# Patient Record
Sex: Female | Born: 1955 | Race: White | Hispanic: No | Marital: Married | Smoking: Never smoker
Health system: Southern US, Community
[De-identification: ages and names within clinical notes are randomized; demographics above are authoritative.]

## PROBLEM LIST (undated history)

## (undated) DIAGNOSIS — I10 Essential (primary) hypertension: Secondary | ICD-10-CM

## (undated) DIAGNOSIS — I639 Cerebral infarction, unspecified: Secondary | ICD-10-CM

## (undated) DIAGNOSIS — D693 Immune thrombocytopenic purpura: Secondary | ICD-10-CM

## (undated) DIAGNOSIS — M1990 Unspecified osteoarthritis, unspecified site: Secondary | ICD-10-CM

## (undated) HISTORY — PX: GASTRIC BYPASS: SHX52

## (undated) HISTORY — PX: CHOLECYSTECTOMY: SHX55

## (undated) HISTORY — PX: BREAST SURGERY: SHX581

## (undated) HISTORY — PX: AUGMENTATION MAMMAPLASTY: SUR837

---

## 1994-08-16 HISTORY — PX: ABDOMINAL HYSTERECTOMY: SHX81

## 2002-08-16 HISTORY — PX: SPLENECTOMY: SUR1306

## 2010-07-06 ENCOUNTER — Emergency Department (HOSPITAL_BASED_OUTPATIENT_CLINIC_OR_DEPARTMENT_OTHER): Admission: EM | Admit: 2010-07-06 | Discharge: 2010-07-06 | Payer: Self-pay | Admitting: Emergency Medicine

## 2010-08-16 HISTORY — PX: OTHER SURGICAL HISTORY: SHX169

## 2013-10-25 ENCOUNTER — Other Ambulatory Visit: Payer: Self-pay

## 2013-10-25 DIAGNOSIS — Z1231 Encounter for screening mammogram for malignant neoplasm of breast: Secondary | ICD-10-CM

## 2013-11-08 ENCOUNTER — Ambulatory Visit: Payer: Self-pay

## 2013-11-12 ENCOUNTER — Ambulatory Visit
Admission: RE | Admit: 2013-11-12 | Discharge: 2013-11-12 | Disposition: A | Discharge status: Home / Self Care | Payer: BC Managed Care – PPO | Source: Ambulatory Visit

## 2013-11-12 DIAGNOSIS — Z1231 Encounter for screening mammogram for malignant neoplasm of breast: Secondary | ICD-10-CM

## 2015-02-13 ENCOUNTER — Other Ambulatory Visit: Payer: Self-pay

## 2015-02-13 DIAGNOSIS — Z1231 Encounter for screening mammogram for malignant neoplasm of breast: Secondary | ICD-10-CM

## 2015-04-14 ENCOUNTER — Ambulatory Visit
Admission: RE | Admit: 2015-04-14 | Discharge: 2015-04-14 | Disposition: A | Discharge status: Home / Self Care | Payer: BLUE CROSS/BLUE SHIELD | Source: Ambulatory Visit

## 2015-04-14 DIAGNOSIS — Z1231 Encounter for screening mammogram for malignant neoplasm of breast: Secondary | ICD-10-CM

## 2015-08-17 DIAGNOSIS — I639 Cerebral infarction, unspecified: Secondary | ICD-10-CM

## 2015-08-17 HISTORY — DX: Cerebral infarction, unspecified: I63.9

## 2015-08-26 ENCOUNTER — Other Ambulatory Visit: Payer: Self-pay

## 2015-08-26 ENCOUNTER — Emergency Department (HOSPITAL_COMMUNITY): Payer: BLUE CROSS/BLUE SHIELD

## 2015-08-26 ENCOUNTER — Emergency Department (HOSPITAL_COMMUNITY)
Admission: EM | Admit: 2015-08-26 | Discharge: 2015-08-26 | Disposition: A | Discharge status: Home / Self Care | Payer: BLUE CROSS/BLUE SHIELD | Attending: Emergency Medicine | Admitting: Emergency Medicine

## 2015-08-26 ENCOUNTER — Encounter (HOSPITAL_COMMUNITY): Payer: Self-pay | Admitting: Emergency Medicine

## 2015-08-26 DIAGNOSIS — I10 Essential (primary) hypertension: Secondary | ICD-10-CM | POA: Diagnosis not present

## 2015-08-26 DIAGNOSIS — R110 Nausea: Secondary | ICD-10-CM | POA: Diagnosis not present

## 2015-08-26 DIAGNOSIS — R079 Chest pain, unspecified: Secondary | ICD-10-CM | POA: Insufficient documentation

## 2015-08-26 DIAGNOSIS — Z862 Personal history of diseases of the blood and blood-forming organs and certain disorders involving the immune mechanism: Secondary | ICD-10-CM | POA: Insufficient documentation

## 2015-08-26 DIAGNOSIS — R05 Cough: Secondary | ICD-10-CM | POA: Insufficient documentation

## 2015-08-26 HISTORY — DX: Essential (primary) hypertension: I10

## 2015-08-26 HISTORY — DX: Immune thrombocytopenic purpura: D69.3

## 2015-08-26 LAB — CBC
HEMATOCRIT: 43.8 % (ref 36.0–46.0)
Hemoglobin: 14.5 g/dL (ref 12.0–15.0)
MCH: 30.5 pg (ref 26.0–34.0)
MCHC: 33.1 g/dL (ref 30.0–36.0)
MCV: 92 fL (ref 78.0–100.0)
PLATELETS: 415 10*3/uL — AB (ref 150–400)
RBC: 4.76 MIL/uL (ref 3.87–5.11)
RDW: 14.8 % (ref 11.5–15.5)
WBC: 20.1 10*3/uL — AB (ref 4.0–10.5)

## 2015-08-26 LAB — I-STAT TROPONIN, ED
TROPONIN I, POC: 0 ng/mL (ref 0.00–0.08)
Troponin i, poc: 0 ng/mL (ref 0.00–0.08)

## 2015-08-26 LAB — DIFFERENTIAL
BASOS ABS: 0.1 10*3/uL (ref 0.0–0.1)
BASOS PCT: 0 %
EOS PCT: 1 %
Eosinophils Absolute: 0.2 10*3/uL (ref 0.0–0.7)
LYMPHS ABS: 9.7 10*3/uL — AB (ref 0.7–4.0)
Lymphocytes Relative: 48 %
MONOS PCT: 6 %
Monocytes Absolute: 1.1 10*3/uL — ABNORMAL HIGH (ref 0.1–1.0)
NEUTROS ABS: 8.9 10*3/uL — AB (ref 1.7–7.7)
Neutrophils Relative %: 45 %

## 2015-08-26 LAB — BASIC METABOLIC PANEL
Anion gap: 12 (ref 5–15)
BUN: 15 mg/dL (ref 6–20)
CHLORIDE: 98 mmol/L — AB (ref 101–111)
CO2: 32 mmol/L (ref 22–32)
CREATININE: 0.65 mg/dL (ref 0.44–1.00)
Calcium: 10.3 mg/dL (ref 8.9–10.3)
GFR calc non Af Amer: >60 mL/min (ref >60)
Glucose, Bld: 144 mg/dL — ABNORMAL HIGH (ref 65–99)
POTASSIUM: 3.3 mmol/L — AB (ref 3.5–5.1)
Sodium: 142 mmol/L (ref 135–145)

## 2015-08-26 MED ORDER — OMEPRAZOLE 20 MG PO CPDR: 20.0000 mg | DELAYED_RELEASE_CAPSULE | Freq: Every day | ORAL | Status: DC

## 2015-08-26 MED ORDER — GI COCKTAIL ~~LOC~~
30.0000 mL | Freq: Once | ORAL | Status: AC
  Administered 2015-08-26: 30 mL via ORAL
  Filled 2015-08-26: qty 30

## 2015-08-26 MED ORDER — KETOROLAC TROMETHAMINE 30 MG/ML IJ SOLN
30.0000 mg | Freq: Once | INTRAMUSCULAR | Status: AC
  Administered 2015-08-26: 30 mg via INTRAVENOUS
  Filled 2015-08-26: qty 1

## 2015-08-26 MED ORDER — MORPHINE SULFATE (PF) 4 MG/ML IV SOLN
4.0000 mg | Freq: Once | INTRAVENOUS | Status: DC
Start: 2015-08-26 — End: 2015-08-26
  Filled 2015-08-26: qty 1

## 2015-08-26 NOTE — Discharge Instructions (Signed)
Ms. Janet SievingDebra Warren,  Nice meeting you! Please follow-up with your primary care provider within one week. Return to the emergency department if you develop chest pain again, nausea/vomiting, shortness of breath. Feel better soon!  S. Lane HackerNicole Phyllistine Domingos, PA-C

## 2015-08-26 NOTE — ED Provider Notes (Signed)
CSN: [id number]     Arrival date & time 08/26/15  1239 History   First MD Initiated Contact with Patient 08/26/15 1259     Chief Complaint  Patient presents with  . Chest Pain   HPI   Janet Powell is a 60 y.o. F PMH significant for ITP, HTN presenting with a 1 day history of chest pain. She states her pain initially started in her back, radiated to midsternal, but now, it is constant in her midsternal chest. She describes her pain as 10/10 pain scale at its worst, intermittent, pressure, non-radiating currently. She states her pain started last night, she laid down to go to bed, it dissipated, but then it started again today while she was at work. She endorses nausea and a recent URI with a productive cough last week. She denies fevers, chills, emesis, abdominal pain, paresthesias.   Past Medical History  Diagnosis Date  . Hypertension   . 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 Cholecystectomy    . Gastric bypass    . Splenectomy  2004   History reviewed. No pertinent family history. Social History  Substance Use Topics  . Smoking status: Never Smoker   . Smokeless tobacco: None  . Alcohol Use: No   OB History    No data available     Review of Systems  Ten systems are reviewed and are negative for acute change except as noted in the HPI  Allergies  Nsaids  Home Medications   Prior to Admission medications   Not on File   BP 168/79 mmHg  Pulse 68  Temp(Src) 97.9 F (36.6 C) (Oral)  Resp 13  SpO2 98% Physical Exam  Constitutional: She is oriented to person, place, and time. She appears well-developed and well-nourished. No distress.  HENT:  Head: Normocephalic and atraumatic.  Right Ear: External ear normal.  Left Ear: External ear normal.  Nose: Nose normal.  Mouth/Throat: Oropharynx is clear and moist. No oropharyngeal exudate.  Eyes: Conjunctivae are normal. Pupils are equal,  round, and reactive to light. Right eye exhibits no discharge. Left eye exhibits no discharge. No scleral icterus.  Neck: Neck supple. No tracheal deviation present.  Cardiovascular: Normal rate, regular rhythm, normal heart sounds and intact distal pulses.  Exam reveals no gallop and no friction rub.   No murmur heard. Pulmonary/Chest: Effort normal and breath sounds normal. No respiratory distress. She has no wheezes. She has no rales. She exhibits no tenderness.  Abdominal: Soft. Bowel sounds are normal. She exhibits no distension and no mass. There is no tenderness. There is no rebound and no guarding.  Musculoskeletal: She exhibits no edema.  Lymphadenopathy:    She has no cervical adenopathy.  Neurological: She is alert and oriented to person, place, and time. Coordination normal.  Skin: Skin is warm and dry. No rash noted. She is not diaphoretic. No erythema.  Psychiatric: She has a normal mood and affect. Her behavior is normal.  Nursing note and vitals reviewed.   ED Course  Procedures  Labs Review Labs Reviewed  BASIC METABOLIC PANEL - Abnormal; Notable for the following:    Potassium 3.3 (*)    Chloride 98 (*)    Glucose, Bld 144 (*)    All other components within normal limits  CBC - Abnormal; Notable for the following:    WBC 20.1 (*)    Platelets 415 (*)    All  other components within normal limits  DIFFERENTIAL - Abnormal; Notable for the following:    Neutro Abs 8.9 (*)    Lymphs Abs 9.7 (*)    Monocytes Absolute 1.1 (*)    All other components within normal limits  I-STAT TROPOININ, ED  I-STAT TROPOININ, ED   Imaging Review Dg Chest 2 View  08/26/2015  CLINICAL DATA:  Mid chest discomfort, recent URI, elevated white blood cell count, history of previous splenectomy EXAM: CHEST  2 VIEW COMPARISON:  Report of a ease chest CT scan dated June 04, 2010. FINDINGS: The lungs are adequately inflated. There is no focal infiltrate. There is no pleural effusion. The  heart and pulmonary vascularity are normal. The mediastinum is normal in width. The trachea is midline. The bony thorax exhibits no acute abnormality. IMPRESSION: There is no active cardiopulmonary disease. Electronically Signed   By: David  SwazilandJordan M.D.   On: 08/26/2015 13:47   I have personally reviewed and evaluated these images and lab results as part of my medical decision-making.   EKG Interpretation   Date/Time:  Tuesday August 26 2015 12:48:17 EST Ventricular Rate:  77 PR Interval:  148 QRS Duration: 88 QT Interval:  384 QTC Calculation: 434 R Axis:   64 Text Interpretation:  Normal sinus rhythm Septal infarct , age  undetermined Abnormal ECG Sinus rhythm q waves anteriorly Abnormal ekg  Confirmed by Gerhard MunchLOCKWOOD, ROBERT  MD [phone]([id number]) on 08/26/2015 1:00:28 PM      MDM   Final diagnoses:  Chest pain, unspecified chest pain type   Patient non-toxic appearing. Hypertensive on exam, otherwise, VSS. Based on patient history and physical exam, most likely etiologies include ACS vs MSK vs anxiety. Less likely etiologies include  Prinzmetal's/cocaine-induced angina, pericarditis/pericardial effusion, cardiac tamponade, constrictive pericarditis, myocarditis, aortic dissection, thoracic aortic aneurysm, CHF/acute pulmonary edema,  pneumonia, pleuritis, pneumothorax, tension pneumothorax, pulmonary embolism, pulmonary HTN, GERD, esophageal spasm, Mallory-Weiss tear, Boerhaave syndrome, peptic ulcer diease, biliary disease, pancreatitis, herpes zoster, sickle cell chest crisis. BMP with mild hypokalemia of 3.3, Cl or 98, hyperglycemia of 144. CBC with leukocytosis of 20.1, plt 415, ab neutro 8.9, ab lym 9.7, mono abs 1.1, howell/jolly bodies, atypical lymphocyctes, large platelets present. Patient is s/p splenectomy for ITP.   Troponin x 2, EKG x 2 unremarkable for acute change.  Patient feeling better upon reassessment after GI cocktail, but states she moved a "certain way," and her pain  returned. Patient may be safely discharged home with PPI. Discussed reasons for return. Patient to follow-up with primary care provider within one week. Patient in understanding and agreement with the plan.   Melton KrebsSamantha Nicole Adolf Ormiston, PA-C 08/28/15 1303  Gerhard Munchobert Lockwood, MD 08/29/15 2204

## 2015-08-26 NOTE — ED Notes (Signed)
Pt sts midsternal CP through to back intermittent since last night worse today with nausea

## 2016-03-18 ENCOUNTER — Other Ambulatory Visit: Payer: Self-pay | Admitting: Internal Medicine

## 2016-03-18 DIAGNOSIS — Z1231 Encounter for screening mammogram for malignant neoplasm of breast: Secondary | ICD-10-CM

## 2016-04-14 ENCOUNTER — Ambulatory Visit
Admission: RE | Admit: 2016-04-14 | Discharge: 2016-04-14 | Disposition: A | Discharge status: Home / Self Care | Payer: BLUE CROSS/BLUE SHIELD | Source: Ambulatory Visit | Attending: Internal Medicine | Admitting: Internal Medicine

## 2016-04-14 DIAGNOSIS — Z1231 Encounter for screening mammogram for malignant neoplasm of breast: Secondary | ICD-10-CM

## 2016-08-31 ENCOUNTER — Ambulatory Visit: Payer: BLUE CROSS/BLUE SHIELD

## 2016-08-31 ENCOUNTER — Other Ambulatory Visit: Payer: Self-pay | Admitting: Nurse Practitioner

## 2016-08-31 ENCOUNTER — Ambulatory Visit (INDEPENDENT_AMBULATORY_CARE_PROVIDER_SITE_OTHER): Payer: BLUE CROSS/BLUE SHIELD

## 2016-08-31 DIAGNOSIS — I639 Cerebral infarction, unspecified: Secondary | ICD-10-CM

## 2016-08-31 DIAGNOSIS — I672 Cerebral atherosclerosis: Secondary | ICD-10-CM

## 2016-08-31 MED ORDER — IOPAMIDOL (ISOVUE-370) INJECTION 76%
100.0000 mL | Freq: Once | INTRAVENOUS | Status: AC | PRN
  Administered 2016-08-31: 100 mL via INTRAVENOUS

## 2016-09-10 HISTORY — PX: OTHER SURGICAL HISTORY: SHX169

## 2017-02-28 ENCOUNTER — Other Ambulatory Visit: Payer: Self-pay | Admitting: Internal Medicine

## 2017-02-28 DIAGNOSIS — Z1231 Encounter for screening mammogram for malignant neoplasm of breast: Secondary | ICD-10-CM

## 2017-04-15 ENCOUNTER — Ambulatory Visit
Admission: RE | Admit: 2017-04-15 | Discharge: 2017-04-15 | Disposition: A | Discharge status: Home / Self Care | Payer: BLUE CROSS/BLUE SHIELD | Source: Ambulatory Visit | Attending: Internal Medicine | Admitting: Internal Medicine

## 2017-04-15 DIAGNOSIS — Z1231 Encounter for screening mammogram for malignant neoplasm of breast: Secondary | ICD-10-CM

## 2018-02-09 NOTE — H&P (Signed)
   Janet KidneyDebra Joellyn RuedWarren Powell is an 62 y.o. female.    Chief Complaint: right knee pain  HPI: Pt is a 62 y.o. female complaining of right knee pain for several weeks. Pain had continually increased since the beginning. X-rays in the clinic show meniscal tear right knee. Pt has tried various conservative treatments which have failed to alleviate their symptoms, including injections and therapy. Various options are discussed with the patient. Risks, benefits and expectations were discussed with the patient. Patient understand the risks, benefits and expectations and wishes to proceed with surgery.   PCP:  Malka SoJobe, Daniel B., MD  D/C Plans: Home  PMH: Past Medical History:  Diagnosis Date  . Hypertension   . Idiopathic thrombocytopenic purpura (ITP)    had spenectomy    PSH: Past Surgical History:  Procedure Laterality Date  . CHOLECYSTECTOMY    . GASTRIC BYPASS    . SPLENECTOMY  2004    Social History:  reports that she has never smoked. She does not have any smokeless tobacco history on file. She reports that she does not drink alcohol or use drugs.  Allergies:  Allergies  Allergen Reactions  . Lisinopril     cough  . Nsaids Other (See Comments)    Because of gastric bypass    Medications: No current facility-administered medications for this encounter.    Current Outpatient Medications  Medication Sig Dispense Refill  . amLODipine (NORVASC) 10 MG tablet Take 10 mg by mouth daily.    . Calcium Citrate-Vitamin D (CALCIUM CITRATE + D) 315-250 MG-UNIT TABS Take 1 tablet by mouth 2 (two) times daily.    . cyanocobalamin ([employer] VITAMIN B12) 2000 MCG tablet Take 2,000 mcg by mouth 2 (two) times daily.    . hydrochlorothiazide (HYDRODIURIL) 25 MG tablet Take 25 mg by mouth daily.    . multivitamin (VIT W/EXTRA C) CHEW chewable tablet Chew 1 tablet by mouth daily.    Marland Kitchen. omeprazole (PRILOSEC) 20 MG capsule Take 1 capsule (20 mg total) by mouth daily. 30 capsule 0  . traZODone (DESYREL)  50 MG tablet Take 100 mg by mouth at bedtime.      No results found for this or any previous visit (from the past 48 hour(s)). No results found.  ROS: Pain with rom of the right lower extremity  Physical Exam: Alert and oriented 62 y.o. female in no acute distress Cranial nerves 2-12 intact Cervical spine: full rom with no tenderness, nv intact distally Chest: active breath sounds bilaterally, no wheeze rhonchi or rales Heart: regular rate and rhythm, no murmur Abd: non tender non distended with active bowel sounds Hip is stable with rom  Right knee with positive mcmurrays Pain with rom and twisting nv intact distally Slight antalgic gait  Assessment/Plan Assessment: right knee pain secondary to meniscal tear  Plan:  Patient will undergo a right knee scope [facility]. Risks benefits and expectations were discussed with the patient. Patient understand risks, benefits and expectations and wishes to proceed. The proposed joint has been templated with x-rays prior to surgery with the corresponding company representative.  Alphonsa OverallBrad Jarion Hawthorne PA-C, MPAS [facility] now [facility]  [place] [address], [place], [place] [address] Phone: [phone] [email or web address] Facebook  [employer]  LinkedIn  Twitter

## 2018-02-20 NOTE — Pre-Procedure Instructions (Signed)
Janet Powell  02/20/2018      [id number] - [place], [place] - [address] AT [facility] [address] [place] [address] Phone: [phone] Fax: [phone]    Your procedure is scheduled on Fri., March 03, 2018 from 7:30AM-8:30AM  Report to [facility] Admitting Entrance "A" at 5:30AM  Call this number if you have problems the morning of surgery:  [phone]   Remember:  Do not eat or drink after midnight on July 18th    Take these medicines the morning of surgery with A SIP OF WATER: AmLODipine (NORVASC) and Pantoprazole (PROTONIX)   Follow your doctors instructions regarding your Aspirin and Plavix.  If no instructions were given by your doctor, then you will need to call the prescribing office office to get instructions.    7 days before surgery (7/12), stop taking all Other Aspirin Products, Vitamins, Fish oils, and Herbal medications. Also stop all NSAIDS i.e. Advil, Ibuprofen, Motrin, Aleve, Anaprox, Naproxen, BC, Goody Powders, and all Supplements.    Do not wear jewelry, make-up or nail polish.  Do not wear lotions, powders, or perfumes, or deodorant.  Do not shave 48 hours prior to surgery.   Do not bring valuables to the hospital.  [facility] is not responsible for any belongings or valuables.  Contacts, dentures or bridgework may not be worn into surgery.  Leave your suitcase in the car.  After surgery it may be brought to your room.  For patients admitted to the hospital, discharge time will be determined by your treatment team.  Patients discharged the day of surgery will not be allowed to drive home.   Special instructions:  [place]- Preparing For Surgery  Before surgery, you can play an important role. Because skin is not sterile, your skin needs to be as free of germs as possible. You can reduce the number of germs on your skin by washing with CHG (chlorahexidine gluconate) Soap  before surgery.  CHG is an antiseptic cleaner which kills germs and bonds with the skin to continue killing germs even after washing.    Oral Hygiene is also important to reduce your risk of infection.  Remember - BRUSH YOUR TEETH THE MORNING OF SURGERY WITH YOUR REGULAR TOOTHPASTE  Please do not use if you have an allergy to CHG or antibacterial soaps. If your skin becomes reddened/irritated stop using the CHG.  Do not shave (including legs and underarms) for at least 48 hours prior to first CHG shower. It is OK to shave your face.  Please follow these instructions carefully.   1. Shower the NIGHT BEFORE SURGERY and the MORNING OF SURGERY with CHG.   2. If you chose to wash your hair, wash your hair first as usual with your normal shampoo.  3. After you shampoo, rinse your hair and body thoroughly to remove the shampoo.  4. Use CHG as you would any other liquid soap. You can apply CHG directly to the skin and wash gently with a scrungie or a clean washcloth.   5. Apply the CHG Soap to your body ONLY FROM THE NECK DOWN.  Do not use on open wounds or open sores. Avoid contact with your eyes, ears, mouth and genitals (private parts). Wash Face and genitals (private parts)  with your normal soap.  6. Wash thoroughly, paying special attention to the area where your surgery will be performed.  7. Thoroughly  rinse your body with warm water from the neck down.  8. DO NOT shower/wash with your normal soap after using and rinsing off the CHG Soap.  9. Pat yourself dry with a CLEAN TOWEL.  10. Wear CLEAN PAJAMAS to bed the night before surgery, wear comfortable clothes the morning of surgery  11. Place CLEAN SHEETS on your bed the night of your first shower and DO NOT SLEEP WITH PETS.  Day of Surgery:  Do not apply any deodorants/lotions.  Please wear clean clothes to the hospital/surgery center.   Remember to brush your teeth WITH YOUR REGULAR TOOTHPASTE.  Please read over the following  fact sheets that you were given. Pain Booklet, Coughing and Deep Breathing and Surgical Site Infection Prevention

## 2018-02-22 ENCOUNTER — Inpatient Hospital Stay (HOSPITAL_COMMUNITY)
Admission: RE | Admit: 2018-02-22 | Discharge: 2018-02-22 | Disposition: A | Discharge status: Home / Self Care | Payer: BLUE CROSS/BLUE SHIELD | Source: Ambulatory Visit

## 2018-02-28 ENCOUNTER — Inpatient Hospital Stay (HOSPITAL_COMMUNITY): Admission: RE | Admit: 2018-02-28 | Payer: Worker's Compensation | Source: Ambulatory Visit

## 2018-05-03 ENCOUNTER — Other Ambulatory Visit (HOSPITAL_COMMUNITY): Payer: Worker's Compensation

## 2018-05-11 ENCOUNTER — Other Ambulatory Visit: Payer: Self-pay | Admitting: Physician Assistant

## 2018-05-11 DIAGNOSIS — Z1231 Encounter for screening mammogram for malignant neoplasm of breast: Secondary | ICD-10-CM

## 2018-05-12 ENCOUNTER — Encounter (HOSPITAL_COMMUNITY): Admission: RE | Payer: Self-pay | Source: Ambulatory Visit

## 2018-05-12 ENCOUNTER — Ambulatory Visit (HOSPITAL_COMMUNITY)
Admission: RE | Admit: 2018-05-12 | Payer: Worker's Compensation | Source: Ambulatory Visit | Admitting: Orthopedic Surgery

## 2018-05-12 SURGERY — ARTHROSCOPY, KNEE
Anesthesia: Monitor Anesthesia Care | Site: Knee | Laterality: Right

## 2018-05-25 NOTE — H&P (Signed)
Patient's anticipated LOS is less than 2 midnights, meeting these requirements: - Younger than 4 - Lives within 1 hour of care - Has a competent adult at home to recover with post-op recover - NO history of  - Chronic pain requiring opiods  - Diabetes  - Coronary Artery Disease  - Heart failure  - Heart attack  - Stroke  - DVT/VTE  - Cardiac arrhythmia  - Respiratory Failure/COPD  - Renal failure  - Anemia  - Advanced Liver disease       Janet Powell is an 62 y.o. female.    Chief Complaint: right knee pain  HPI: Pt is a 62 y.o. female complaining of right knee pain for multiple years. Pain had continually increased since the beginning. X-rays in the clinic show meniscal tear right knee. Pt has tried various conservative treatments which have failed to alleviate their symptoms, including injections and therapy. Various options are discussed with the patient. Risks, benefits and expectations were discussed with the patient. Patient understand the risks, benefits and expectations and wishes to proceed with surgery.   PCP:  Malka So., MD  D/C Plans: Home  PMH: Past Medical History:  Diagnosis Date  . Hypertension   . Idiopathic thrombocytopenic purpura (ITP)    had spenectomy    PSH: Past Surgical History:  Procedure Laterality Date  . CHOLECYSTECTOMY    . GASTRIC BYPASS    . SPLENECTOMY  2004    Social History:  reports that she has never smoked. She does not have any smokeless tobacco history on file. She reports that she does not drink alcohol or use drugs.  Allergies:  Allergies  Allergen Reactions  . Lisinopril     cough  . Nsaids Other (See Comments)    Because of gastric bypass    Medications: No current facility-administered medications for this encounter.    Current Outpatient Medications  Medication Sig Dispense Refill  . amLODipine (NORVASC) 5 MG tablet Take 5 mg by mouth daily.    Marland Kitchen aspirin EC 81 MG tablet Take 81 mg by  mouth daily.    Marland Kitchen atorvastatin (LIPITOR) 40 MG tablet Take 40 mg by mouth daily.    . Calcium Carb-Cholecalciferol (CALCIUM 600 + D PO) Take 1 tablet by mouth daily.     . clopidogrel (PLAVIX) 75 MG tablet Take 75 mg by mouth daily.    . Cyanocobalamin (B-12) 1000 MCG SUBL Place 1,000 mcg under the tongue daily.    . pantoprazole (PROTONIX) 40 MG tablet Take 40 mg by mouth daily.    . Pediatric Multiple Vit-C-FA (PEDIATRIC MULTIVITAMIN) chewable tablet Chew 1 tablet by mouth 2 (two) times daily.    Marland Kitchen telmisartan (MICARDIS) 40 MG tablet Take 40 mg by mouth daily.    . traZODone (DESYREL) 50 MG tablet Take 100 mg by mouth at bedtime.    Marland Kitchen omeprazole (PRILOSEC) 20 MG capsule Take 1 capsule (20 mg total) by mouth daily. (Patient not taking: Reported on 02/20/2018) 30 capsule 0    No results found for this or any previous visit (from the past 48 hour(s)). No results found.  ROS: Pain with rom of the right lower extremity  Physical Exam: Alert and oriented 62 y.o. female in no acute distress Cranial nerves 2-12 intact Cervical spine: full rom with no tenderness, nv intact distally Chest: active breath sounds bilaterally, no wheeze rhonchi or rales Heart: regular rate and rhythm, no murmur Abd: non tender non distended with active bowel sounds  Hip is stable with rom  Right knee with medial and lateral joint line tenderness nv intact distally Slight antalgic gait  Assessment/Plan Assessment: right knee pain secondary to meniscal tear  Plan:  Patient will undergo a right knee scope by Dr. Ranell Patrick at [facility]. Risks benefits and expectations were discussed with the patient. Patient understand risks, benefits and expectations and wishes to proceed. Preoperative templating of the joint replacement has been completed, documented, and submitted to the Operating Room personnel in order to optimize intra-operative equipment management.   Alphonsa Overall PA-C, MPAS [facility] is now  [employer] [address], [address], [place], [place] [address] Phone: [phone] [email or web address] Facebook  [employer]

## 2018-05-25 NOTE — Pre-Procedure Instructions (Signed)
Janet Powell  05/25/2018      [id number] - [place], [place] - [address] AT [facility] [address] [place] [address] Phone: [phone] Fax: [phone]    Your procedure is scheduled on June 02, 2018.  Report to [facility] Admitting at [place] AM.  Call this number if you have problems the morning of surgery:  [phone]   Remember:  Do not eat or drink after midnight.    Take these medicines the morning of surgery with A SIP OF WATER  Amlodipine (norvasc) Pantoprazole (protonix)  Follow your surgeon's instructions on when to hold/resume aspirin/plavix.  If no instructions were given call the office to determine how they would like to you take aspirin/plavix  7 days prior to surgery STOP taking any Aleve, Naproxen, Ibuprofen, Motrin, Advil, Goody's, BC's, all herbal medications, fish oil, and all vitamins    Do not wear jewelry, make-up or nail polish.  Do not wear lotions, powders, or perfumes, or deodorant.  Do not shave 48 hours prior to surgery.    Do not bring valuables to the hospital.  [facility] is not responsible for any belongings or valuables.  Contacts, dentures or bridgework may not be worn into surgery.  Leave your suitcase in the car.  After surgery it may be brought to your room.  For patients admitted to the hospital, discharge time will be determined by your treatment team.  Patients discharged the day of surgery will not be allowed to drive home.    [place]- Preparing For Surgery  Before surgery, you can play an important role. Because skin is not sterile, your skin needs to be as free of germs as possible. You can reduce the number of germs on your skin by washing with CHG (chlorahexidine gluconate) Soap before surgery.  CHG is an antiseptic cleaner which kills germs and bonds with the skin to continue killing germs even after washing.    Oral Hygiene is also  important to reduce your risk of infection.  Remember - BRUSH YOUR TEETH THE MORNING OF SURGERY WITH YOUR REGULAR TOOTHPASTE  Please do not use if you have an allergy to CHG or antibacterial soaps. If your skin becomes reddened/irritated stop using the CHG.  Do not shave (including legs and underarms) for at least 48 hours prior to first CHG shower. It is OK to shave your face.  Please follow these instructions carefully.   1. Shower the NIGHT BEFORE SURGERY and the MORNING OF SURGERY with CHG.   2. If you chose to wash your hair, wash your hair first as usual with your normal shampoo.  3. After you shampoo, rinse your hair and body thoroughly to remove the shampoo.  4. Use CHG as you would any other liquid soap. You can apply CHG directly to the skin and wash gently with a scrungie or a clean washcloth.   5. Apply the CHG Soap to your body ONLY FROM THE NECK DOWN.  Do not use on open wounds or open sores. Avoid contact with your eyes, ears, mouth and genitals (private parts). Wash Face and genitals (private parts)  with your normal soap.  6. Wash thoroughly, paying special attention to the area where your surgery will be performed.  7. Thoroughly rinse your body with warm water from the neck down.  8. DO NOT shower/wash with your normal soap after using and rinsing off the  CHG Soap.  9. Pat yourself dry with a CLEAN TOWEL.  10. Wear CLEAN PAJAMAS to bed the night before surgery, wear comfortable clothes the morning of surgery  11. Place CLEAN SHEETS on your bed the night of your first shower and DO NOT SLEEP WITH PETS.  Day of Surgery:  Do not apply any deodorants/lotions.  Please wear clean clothes to the hospital/surgery center.   Remember to brush your teeth WITH YOUR REGULAR TOOTHPASTE.  Please read over the fact sheets that you were given.

## 2018-05-29 ENCOUNTER — Encounter (HOSPITAL_COMMUNITY)
Admission: RE | Admit: 2018-05-29 | Discharge: 2018-05-29 | Disposition: A | Discharge status: Home / Self Care | Payer: Worker's Compensation | Source: Ambulatory Visit | Attending: Orthopedic Surgery | Admitting: Orthopedic Surgery

## 2018-05-29 ENCOUNTER — Other Ambulatory Visit: Payer: Self-pay

## 2018-05-29 ENCOUNTER — Encounter (HOSPITAL_COMMUNITY): Payer: Self-pay

## 2018-05-29 DIAGNOSIS — Z01812 Encounter for preprocedural laboratory examination: Secondary | ICD-10-CM | POA: Insufficient documentation

## 2018-05-29 DIAGNOSIS — S83206A Unspecified tear of unspecified meniscus, current injury, right knee, initial encounter: Secondary | ICD-10-CM | POA: Diagnosis not present

## 2018-05-29 DIAGNOSIS — X58XXXA Exposure to other specified factors, initial encounter: Secondary | ICD-10-CM | POA: Insufficient documentation

## 2018-05-29 HISTORY — DX: Cerebral infarction, unspecified: I63.9

## 2018-05-29 HISTORY — DX: Unspecified osteoarthritis, unspecified site: M19.90

## 2018-05-29 LAB — BASIC METABOLIC PANEL
ANION GAP: 5 (ref 5–15)
BUN: 17 mg/dL (ref 8–23)
CALCIUM: 9.7 mg/dL (ref 8.9–10.3)
CO2: 29 mmol/L (ref 22–32)
CREATININE: 1.04 mg/dL — AB (ref 0.44–1.00)
Chloride: 110 mmol/L (ref 98–111)
GFR calc Af Amer: >60 mL/min (ref >60)
GFR, EST NON AFRICAN AMERICAN: 56 mL/min — AB (ref >60)
GLUCOSE: 107 mg/dL — AB (ref 70–99)
Potassium: 4.3 mmol/L (ref 3.5–5.1)
Sodium: 144 mmol/L (ref 135–145)

## 2018-05-29 LAB — CBC
HCT: 38.4 % (ref 36.0–46.0)
Hemoglobin: 11.7 g/dL — ABNORMAL LOW (ref 12.0–15.0)
MCH: 29.6 pg (ref 26.0–34.0)
MCHC: 30.5 g/dL (ref 30.0–36.0)
MCV: 97.2 fL (ref 80.0–100.0)
PLATELETS: 398 10*3/uL (ref 150–400)
RBC: 3.95 MIL/uL (ref 3.87–5.11)
RDW: 14.7 % (ref 11.5–15.5)
WBC: 11.3 10*3/uL — ABNORMAL HIGH (ref 4.0–10.5)
nRBC: 0 % (ref 0.0–0.2)

## 2018-05-29 LAB — PROTIME-INR
INR: 0.94
PROTHROMBIN TIME: 12.5 s (ref 11.4–15.2)

## 2018-05-29 NOTE — Progress Notes (Addendum)
PCP: Romie Jumper, PA-C  Cardiologist: pt denies  EKG: 04/2018 in Care Everywhere-requested from Romie Jumper, PA-C  Stress test: 2017-Care Everywhere -requested from Romie Jumper PA-C  ECHO: 08/2016 results in Care everywhere  Cardiac Cath:  Pt denies  Chest x-ray: pt denies past year, no recent respiratory infections/complications

## 2018-05-30 NOTE — Progress Notes (Addendum)
Anesthesia Chart Review:  Case:  [id number] Date/Time:  06/02/18 1315   Procedure:  RIGHT KNEE ARTHROSCOPY WITH MEDIAL AND LATERAL MENISECTOMY (Right Knee)   Anesthesia type:  Monitor Anesthesia Care   Pre-op diagnosis:  Right knee medial and lateral meniscal tears   Location:  [facility] ROOM 05 / [facility]   Surgeon:  Beverely Low, MD     DISCUSSION: 62 yo female never smoker. Pertinent hx includes HTN, ITP s/p splenectomy, Chronic leukocytosis, Multiple Stroke/TIA (s/p L M1-2 angioplasty 09/08/16), Obesity s/p gastric bypass.  She was seen by neurology Thereasa Solo NP on 05/22/2018 for preop clearance. Per her note in care everywhere "Proceed with surgery next week. Plan to stop plavix 2 days prior to surgery, remain on aspirin. This was discussed between neurosurgery and ortho surgical team. Patient to ask surgeon when safe to restart plavix."  She was seen by her PCP 05/08/2018 for f/u of HLD and HTN. At that appointment it was also discussed that the pt was having some chest pain that sounded atypical. However, given her hx of multiple TIA/CVA, a referral was made to cardiology. It does not appear pt has seen cardiology yet.  Pt has had multiple echos for previous stroke workup. Most recent TTE 09/06/2016 showed new wall motion abnormalities compared to TTE from 08/12/2016.  TTE showing WMA was done during hospitalization Jan 2018 during which pt had L M2 angioplasty and stenting. Notes in care everywhere state that cardiology was consulted prior to angioplasty because of the echo and stated " No additional work-up necessary, ok to proceed taking risk/benefit ratio into consideration". I do not see any outpatient cardiac followup.  Given pt's recent referral to cardiology for chest pain and history of abn echo with WMA, I discussed the case with Dr. Krista Blue. He advised that the pt should have cardiac clearance prior to elective orthopedic surgery. I called Dr. Dietrich Pates scheduler, Jacki Cones, and let her know.  Cardiac clearance pending.  Addendum 06/01/2018: Pt was seen by cardiology for preop clearance by Elwin Sleight, MD 06/01/2018. Per her note in care everywhere: "Pre-surgical risk assessment R Knee arthroplasty Asymptomatic; euvolemic and well perfused Able to perform well over 4 METS Reviewed echo images from 2018; no significant abnormalities with normal LVEF Given high functional status, there is no indication for further cardiac work-up at this time"   VS: BP (!) 138/53   Pulse 65   Temp 36.8 C (Oral)   Resp 18   Ht 5\' 1"  (1.549 m)   Wt 72.3 kg   SpO2 99%   BMI 30.12 kg/m   PROVIDERS: Malka So., MD is PCP  Thereasa Solo, NP is Neurology provider  Lemar Livings, MD is Neurosurgeon  LABS: Labs reviewed: Acceptable for surgery. (all labs ordered are listed, but only abnormal results are displayed)  Labs Reviewed  BASIC METABOLIC PANEL - Abnormal; Notable for the following components:      Result Value   Glucose, Bld 107 (*)    Creatinine, Ser 1.04 (*)    GFR calc non Af Amer 56 (*)    All other components within normal limits  CBC - Abnormal; Notable for the following components:   WBC 11.3 (*)    Hemoglobin 11.7 (*)    All other components within normal limits  PROTIME-INR     IMAGES: MRI Brain 01/27/2018 (care everywhere): FINDINGS: Calvarium/skull base: No focal marrow replacing lesion. Degenerative changes of the partially imaged cervical spine, most pronounced at C5-6. Orbits: No focal  mass. Paranasal sinuses: No air-fluid levels or substantial mucosal disease. Brain: No evidence of acute abnormality. No significant interval change in appearance of the brain parenchyma, as previously described in detail. No evidence of acute infarct. No mass effect, acute hemorrhage, or hydrocephalus. Grossly normal flow-related signal in the major intracranial arteries and dural sinuses.     Additional comments: The left middle cerebral artery stent is not well  demonstrated by MR technique.  CONCLUSION:  No acute or interval ischemia.  CHEST  2 VIEW 08/26/2015:  COMPARISON:  Report of a ease chest CT scan dated June 04, 2010.  FINDINGS: The lungs are adequately inflated. There is no focal infiltrate. There is no pleural effusion. The heart and pulmonary vascularity are normal. The mediastinum is normal in width. The trachea is midline. The bony thorax exhibits no acute abnormality.  IMPRESSION: There is no active cardiopulmonary disease.  EKG: 05/08/2018 (care everywhere): NSR rate 64  CV: TTE 09/06/2016 (care everywhere):  SUMMARY The left ventricular size is normal. There is mild concentric left ventricular hypertrophy.  Left ventricular systolic function is normal. LV ejection fraction = 55%. There is mid and basal LV posterior wall hypokinesis There is subtle apical LV lateral wall mild hypokinesis  Left ventricular filling pattern is impaired. The right ventricle is normal in size and function. The left atrium is mildly dilated. Injection of agitated saline showed no right-to-left shunt. There is aortic valve sclerosis. The aortic valve opens well. There is mild mitral regurgitation. There is mild tricuspid regurgitation. IVC size was normal. There is breat implant. Compared to previous study dated 07/2016, there has been interval development  of wall motion abnormalities as described.  TTE 07/17/2016 (care everywhere): SUMMARY The left ventricular size is normal with mild concentric left ventricular  hypertrophy.  Left ventricular systolic function is normal; LVEF = 55-60%. Left ventricular  filling pattern is impaired. The right ventricle is normal in size and function. There is no significant valvular stenosis or regurgitation. There is a trivial pericardial effusion with no echocardiographic features of  cardiac tamponade. There is a large (> 7 cm) echolucent space adjacent to heart seen on clip 8  that  may represent ascites or a pleural effusion though difficult to say on  these limited images. If clinically warranted, recommend dedicated abdominal  ultrasound or alternative imaging. No prior echocardiogram available for comparison. - FINDINGS:  LEFT VENTRICLE The left ventricular size is normal. There is mild concentric left  ventricular hypertrophy. Left ventricular systolic function is normal. LV  ejection fraction = 55-60%. Left ventricular filling pattern is impaired. The  left ventricular wall motion is normal.  Past Medical History:  Diagnosis Date  . Arthritis   . Hypertension   . Idiopathic thrombocytopenic purpura (ITP) (HCC)    had spenectomy  . Stroke [facility]) 2017    Past Surgical History:  Procedure Laterality Date  . ABDOMINAL HYSTERECTOMY  1996  . BREAST SURGERY     inplants 2012  . CHOLECYSTECTOMY    . GASTRIC BYPASS    . SPLENECTOMY  2004  . stents placed in brain  09/10/2016  . tummy tuck  2012    MEDICATIONS: . amLODipine (NORVASC) 5 MG tablet  . aspirin EC 81 MG tablet  . atorvastatin (LIPITOR) 40 MG tablet  . Calcium Carb-Cholecalciferol (CALCIUM 600 + D PO)  . clopidogrel (PLAVIX) 75 MG tablet  . Cyanocobalamin (B-12) 1000 MCG SUBL  . omeprazole (PRILOSEC) 20 MG capsule  . pantoprazole (PROTONIX) 40 MG tablet  .  Pediatric Multiple Vit-C-FA (PEDIATRIC MULTIVITAMIN) chewable tablet  . telmisartan (MICARDIS) 40 MG tablet  . traZODone (DESYREL) 50 MG tablet   No current facility-administered medications for this encounter.     [facility] Short Stay [facility]/Anesthesiology Phone [phone] 05/30/2018 11:48 AM

## 2018-06-01 NOTE — Anesthesia Preprocedure Evaluation (Addendum)
Anesthesia Evaluation  Patient identified by MRN, date of birth, ID band Patient awake    Reviewed: Allergy & Precautions, NPO status , Patient's Chart, lab work & pertinent test results  Airway Mallampati: II  TM Distance: >3 FB Neck ROM: Full    Dental no notable dental hx. (+) Teeth Intact, Dental Advisory Given, Implants   Pulmonary neg pulmonary ROS,    Pulmonary exam normal breath sounds clear to auscultation       Cardiovascular Exercise Tolerance: Good hypertension, Pt. on medications Normal cardiovascular exam Rhythm:Regular Rate:Normal     Neuro/Psych TIACVA, No Residual Symptoms negative psych ROS   GI/Hepatic negative GI ROS, Neg liver ROS,   Endo/Other  negative endocrine ROS  Renal/GU negative Renal ROS     Musculoskeletal  (+) Arthritis ,   Abdominal (+)   Bowel sounds: normal.  Peds  Hematology   Anesthesia Other Findings   Reproductive/Obstetrics                           Lab Results  Component Value Date   WBC 11.3 (H) 05/29/2018   HGB 11.7 (L) 05/29/2018   HCT 38.4 05/29/2018   MCV 97.2 05/29/2018   PLT 398 05/29/2018    Anesthesia Physical Anesthesia Plan  ASA: III  Anesthesia Plan: General   Post-op Pain Management:    Induction: Intravenous  PONV Risk Score and Plan: 3 and Treatment may vary due to age or medical condition, Ondansetron and Dexamethasone  Airway Management Planned: LMA  Additional Equipment:   Intra-op Plan:   Post-operative Plan:   Informed Consent: I have reviewed the patients History and Physical, chart, labs and discussed the procedure including the risks, benefits and alternatives for the proposed anesthesia with the patient or authorized representative who has indicated his/her understanding and acceptance.   Dental advisory given  Plan Discussed with: CRNA  Anesthesia Plan Comments:        Anesthesia Quick  Evaluation

## 2018-06-02 ENCOUNTER — Ambulatory Visit (HOSPITAL_COMMUNITY): Payer: Worker's Compensation | Admitting: Anesthesiology

## 2018-06-02 ENCOUNTER — Ambulatory Visit (HOSPITAL_COMMUNITY)
Admission: RE | Admit: 2018-06-02 | Discharge: 2018-06-02 | Disposition: A | Discharge status: Home / Self Care | Payer: Worker's Compensation | Source: Ambulatory Visit | Attending: Orthopedic Surgery | Admitting: Orthopedic Surgery

## 2018-06-02 ENCOUNTER — Encounter (HOSPITAL_COMMUNITY)
Admission: RE | Disposition: A | Discharge status: Home / Self Care | Payer: Self-pay | Source: Ambulatory Visit | Attending: Orthopedic Surgery

## 2018-06-02 ENCOUNTER — Ambulatory Visit (HOSPITAL_COMMUNITY): Payer: Worker's Compensation | Admitting: Physician Assistant

## 2018-06-02 ENCOUNTER — Encounter (HOSPITAL_COMMUNITY): Payer: Self-pay | Admitting: Anesthesiology

## 2018-06-02 DIAGNOSIS — Z9081 Acquired absence of spleen: Secondary | ICD-10-CM | POA: Diagnosis not present

## 2018-06-02 DIAGNOSIS — D693 Immune thrombocytopenic purpura: Secondary | ICD-10-CM | POA: Diagnosis not present

## 2018-06-02 DIAGNOSIS — Z9884 Bariatric surgery status: Secondary | ICD-10-CM | POA: Insufficient documentation

## 2018-06-02 DIAGNOSIS — M94261 Chondromalacia, right knee: Secondary | ICD-10-CM | POA: Insufficient documentation

## 2018-06-02 DIAGNOSIS — M1990 Unspecified osteoarthritis, unspecified site: Secondary | ICD-10-CM | POA: Diagnosis not present

## 2018-06-02 DIAGNOSIS — Z888 Allergy status to other drugs, medicaments and biological substances status: Secondary | ICD-10-CM | POA: Insufficient documentation

## 2018-06-02 DIAGNOSIS — Z79899 Other long term (current) drug therapy: Secondary | ICD-10-CM | POA: Insufficient documentation

## 2018-06-02 DIAGNOSIS — M23221 Derangement of posterior horn of medial meniscus due to old tear or injury, right knee: Secondary | ICD-10-CM | POA: Diagnosis not present

## 2018-06-02 DIAGNOSIS — Z7982 Long term (current) use of aspirin: Secondary | ICD-10-CM | POA: Diagnosis not present

## 2018-06-02 DIAGNOSIS — M23200 Derangement of unspecified lateral meniscus due to old tear or injury, right knee: Secondary | ICD-10-CM | POA: Diagnosis not present

## 2018-06-02 DIAGNOSIS — S83241A Other tear of medial meniscus, current injury, right knee, initial encounter: Secondary | ICD-10-CM | POA: Diagnosis present

## 2018-06-02 DIAGNOSIS — I10 Essential (primary) hypertension: Secondary | ICD-10-CM | POA: Insufficient documentation

## 2018-06-02 DIAGNOSIS — Z9049 Acquired absence of other specified parts of digestive tract: Secondary | ICD-10-CM | POA: Diagnosis not present

## 2018-06-02 HISTORY — PX: KNEE ARTHROSCOPY WITH MEDIAL MENISECTOMY: SHX5651

## 2018-06-02 SURGERY — ARTHROSCOPY, KNEE, WITH MEDIAL MENISCECTOMY
Anesthesia: General | Site: Knee | Laterality: Right

## 2018-06-02 MED ORDER — CEFAZOLIN SODIUM-DEXTROSE 2-4 GM/100ML-% IV SOLN
INTRAVENOUS | Status: AC
  Filled 2018-06-02: qty 100

## 2018-06-02 MED ORDER — LIDOCAINE 2% (20 MG/ML) 5 ML SYRINGE
INTRAMUSCULAR | Status: DC | PRN
  Administered 2018-06-02: 100 mg via INTRAVENOUS

## 2018-06-02 MED ORDER — BUPIVACAINE-EPINEPHRINE 0.5% -1:200000 IJ SOLN
INTRAMUSCULAR | Status: DC | PRN
  Administered 2018-06-02: 10 mL

## 2018-06-02 MED ORDER — BUPIVACAINE-EPINEPHRINE 0.5% -1:200000 IJ SOLN
INTRAMUSCULAR | Status: AC
  Filled 2018-06-02: qty 1

## 2018-06-02 MED ORDER — CEFAZOLIN SODIUM-DEXTROSE 2-4 GM/100ML-% IV SOLN
2.0000 g | INTRAVENOUS | Status: AC
  Administered 2018-06-02: 2 g via INTRAVENOUS

## 2018-06-02 MED ORDER — CHLORHEXIDINE GLUCONATE 4 % EX LIQD: 60.0000 mL | Freq: Once | CUTANEOUS | Status: DC

## 2018-06-02 MED ORDER — LIDOCAINE 2% (20 MG/ML) 5 ML SYRINGE
INTRAMUSCULAR | Status: AC
  Filled 2018-06-02: qty 5

## 2018-06-02 MED ORDER — ROCURONIUM BROMIDE 50 MG/5ML IV SOSY
PREFILLED_SYRINGE | INTRAVENOUS | Status: AC
  Filled 2018-06-02: qty 5

## 2018-06-02 MED ORDER — GABAPENTIN 300 MG PO CAPS
ORAL_CAPSULE | ORAL | Status: AC
  Administered 2018-06-02: 300 mg
  Filled 2018-06-02: qty 1

## 2018-06-02 MED ORDER — MIDAZOLAM HCL 5 MG/5ML IJ SOLN
INTRAMUSCULAR | Status: DC | PRN
  Administered 2018-06-02: 2 mg via INTRAVENOUS

## 2018-06-02 MED ORDER — GABAPENTIN 300 MG PO CAPS: 300.0000 mg | ORAL_CAPSULE | Freq: Once | ORAL | Status: DC

## 2018-06-02 MED ORDER — ACETAMINOPHEN 500 MG PO TABS
1000.0000 mg | ORAL_TABLET | Freq: Once | ORAL | Status: AC
  Administered 2018-06-02: 1000 mg via ORAL

## 2018-06-02 MED ORDER — ONDANSETRON HCL 4 MG/2ML IJ SOLN
INTRAMUSCULAR | Status: AC
  Filled 2018-06-02: qty 2

## 2018-06-02 MED ORDER — EPHEDRINE SULFATE-NACL 50-0.9 MG/10ML-% IV SOSY
PREFILLED_SYRINGE | INTRAVENOUS | Status: DC | PRN
  Administered 2018-06-02 (×3): 10 mg via INTRAVENOUS

## 2018-06-02 MED ORDER — ACETAMINOPHEN 500 MG PO TABS
ORAL_TABLET | ORAL | Status: AC
  Administered 2018-06-02: 1000 mg via ORAL
  Filled 2018-06-02: qty 2

## 2018-06-02 MED ORDER — FENTANYL CITRATE (PF) 100 MCG/2ML IJ SOLN
INTRAMUSCULAR | Status: DC | PRN
  Administered 2018-06-02: 50 ug via INTRAVENOUS

## 2018-06-02 MED ORDER — HYDROMORPHONE HCL 1 MG/ML IJ SOLN
0.2500 mg | INTRAMUSCULAR | Status: DC | PRN
  Administered 2018-06-02 (×2): 0.5 mg via INTRAVENOUS

## 2018-06-02 MED ORDER — DEXAMETHASONE SODIUM PHOSPHATE 10 MG/ML IJ SOLN
INTRAMUSCULAR | Status: DC | PRN
  Administered 2018-06-02: 10 mg via INTRAVENOUS

## 2018-06-02 MED ORDER — PROMETHAZINE HCL 25 MG/ML IJ SOLN: 6.2500 mg | INTRAMUSCULAR | Status: DC | PRN

## 2018-06-02 MED ORDER — BUPIVACAINE-EPINEPHRINE 0.25% -1:200000 IJ SOLN
INTRAMUSCULAR | Status: DC | PRN
  Administered 2018-06-02: 20 mL

## 2018-06-02 MED ORDER — SODIUM CHLORIDE 0.9 % IR SOLN
Status: DC | PRN
  Administered 2018-06-02 (×3): 3000 mL

## 2018-06-02 MED ORDER — OXYCODONE-ACETAMINOPHEN 5-325 MG PO TABS: 1.0000 | ORAL_TABLET | ORAL | 0 refills | Status: AC | PRN

## 2018-06-02 MED ORDER — MEPERIDINE HCL 50 MG/ML IJ SOLN: 6.2500 mg | INTRAMUSCULAR | Status: DC | PRN

## 2018-06-02 MED ORDER — METHOCARBAMOL 500 MG PO TABS: 500.0000 mg | ORAL_TABLET | Freq: Three times a day (TID) | ORAL | 1 refills | Status: AC | PRN

## 2018-06-02 MED ORDER — PROPOFOL 10 MG/ML IV BOLUS
INTRAVENOUS | Status: DC | PRN
  Administered 2018-06-02: 130 mg via INTRAVENOUS

## 2018-06-02 MED ORDER — ONDANSETRON HCL 4 MG PO TABS: 4.0000 mg | ORAL_TABLET | Freq: Three times a day (TID) | ORAL | 0 refills | Status: AC | PRN

## 2018-06-02 MED ORDER — HYDROCODONE-ACETAMINOPHEN 7.5-325 MG PO TABS: 1.0000 | ORAL_TABLET | Freq: Once | ORAL | Status: DC | PRN

## 2018-06-02 MED ORDER — LACTATED RINGERS IV SOLN
INTRAVENOUS | Status: DC
  Administered 2018-06-02: 10:00:00 via INTRAVENOUS

## 2018-06-02 MED ORDER — EPHEDRINE 5 MG/ML INJ
INTRAVENOUS | Status: AC
  Filled 2018-06-02: qty 10

## 2018-06-02 MED ORDER — STERILE WATER FOR IRRIGATION IR SOLN
Status: DC | PRN
  Administered 2018-06-02: 1000 mL

## 2018-06-02 MED ORDER — PROPOFOL 10 MG/ML IV BOLUS
INTRAVENOUS | Status: AC
  Filled 2018-06-02: qty 20

## 2018-06-02 MED ORDER — MIDAZOLAM HCL 2 MG/2ML IJ SOLN
INTRAMUSCULAR | Status: AC
  Filled 2018-06-02: qty 2

## 2018-06-02 MED ORDER — DEXAMETHASONE SODIUM PHOSPHATE 10 MG/ML IJ SOLN
INTRAMUSCULAR | Status: AC
  Filled 2018-06-02: qty 1

## 2018-06-02 MED ORDER — HYDROMORPHONE HCL 1 MG/ML IJ SOLN
INTRAMUSCULAR | Status: AC
  Filled 2018-06-02: qty 1

## 2018-06-02 MED ORDER — BUPIVACAINE-EPINEPHRINE (PF) 0.25% -1:200000 IJ SOLN
INTRAMUSCULAR | Status: AC
  Filled 2018-06-02: qty 30

## 2018-06-02 MED ORDER — FENTANYL CITRATE (PF) 250 MCG/5ML IJ SOLN
INTRAMUSCULAR | Status: AC
  Filled 2018-06-02: qty 5

## 2018-06-02 SURGICAL SUPPLY — 36 items
BANDAGE ELASTIC 6 VELCRO ST LF (GAUZE/BANDAGES/DRESSINGS) ×2 IMPLANT
BLADE CUTTER GATOR 3.5 (BLADE) ×2 IMPLANT
BLADE SURG 11 STRL SS (BLADE) ×2 IMPLANT
BNDG COHESIVE 6X5 TAN STRL LF (GAUZE/BANDAGES/DRESSINGS) ×2 IMPLANT
BNDG ELASTIC 6X10 VLCR STRL LF (GAUZE/BANDAGES/DRESSINGS) ×2 IMPLANT
BNDG GAUZE ELAST 4 BULKY (GAUZE/BANDAGES/DRESSINGS) ×2 IMPLANT
COVER WAND RF STERILE (DRAPES) ×2 IMPLANT
DRAPE ARTHROSCOPY W/POUCH 114 (DRAPES) ×2 IMPLANT
DURAPREP 26ML APPLICATOR (WOUND CARE) ×2 IMPLANT
ELECT MENISCUS 165MM 90D (ELECTRODE) ×2 IMPLANT
ELECT REM PT RETURN 9FT ADLT (ELECTROSURGICAL) ×2
ELECTRODE REM PT RTRN 9FT ADLT (ELECTROSURGICAL) ×1 IMPLANT
GAUZE SPONGE 4X4 12PLY STRL (GAUZE/BANDAGES/DRESSINGS) IMPLANT
GAUZE SPONGE 4X4 12PLY STRL LF (GAUZE/BANDAGES/DRESSINGS) ×2 IMPLANT
GLOVE BIOGEL PI ORTHO PRO SZ8 (GLOVE) ×1
GLOVE PI ORTHO PRO STRL SZ8 (GLOVE) ×1 IMPLANT
GLOVE SURG ORTHO 8.5 STRL (GLOVE) ×2 IMPLANT
GOWN STRL REUS W/ TWL XL LVL3 (GOWN DISPOSABLE) ×2 IMPLANT
GOWN STRL REUS W/TWL XL LVL3 (GOWN DISPOSABLE) ×2
KIT BASIN OR (CUSTOM PROCEDURE TRAY) ×2 IMPLANT
KIT TURNOVER KIT B (KITS) ×2 IMPLANT
MANIFOLD NEPTUNE II (INSTRUMENTS) ×2 IMPLANT
NEEDLE SPNL 18GX3.5 QUINCKE PK (NEEDLE) ×2 IMPLANT
PACK ARTHROSCOPY DSU (CUSTOM PROCEDURE TRAY) ×2 IMPLANT
PAD ARMBOARD 7.5X6 YLW CONV (MISCELLANEOUS) ×4 IMPLANT
PENCIL BUTTON HOLSTER BLD 10FT (ELECTRODE) IMPLANT
SET ARTHROSCOPY TUBING (MISCELLANEOUS) ×1
SET ARTHROSCOPY TUBING LN (MISCELLANEOUS) ×1 IMPLANT
SPONGE LAP 18X18 X RAY DECT (DISPOSABLE) ×2 IMPLANT
STRIP CLOSURE SKIN 1/2X4 (GAUZE/BANDAGES/DRESSINGS) ×2 IMPLANT
SUT MNCRL AB 4-0 PS2 18 (SUTURE) ×2 IMPLANT
TOWEL OR 17X24 6PK STRL BLUE (TOWEL DISPOSABLE) ×4 IMPLANT
TUBE CONNECTING 12X1/4 (SUCTIONS) ×2 IMPLANT
WAND HAND CNTRL MULTIVAC 90 (MISCELLANEOUS) ×2 IMPLANT
WATER STERILE IRR 1000ML POUR (IV SOLUTION) ×2 IMPLANT
WRAP KNEE MAXI GEL POST OP (GAUZE/BANDAGES/DRESSINGS) ×2 IMPLANT

## 2018-06-02 NOTE — Anesthesia Procedure Notes (Signed)
Procedure Name: Intubation Date/Time: 06/02/2018 12:27 PM Performed by: Quentin Ore, CRNA Pre-anesthesia Checklist: Patient identified, Emergency Drugs available, Suction available, Patient being monitored and Timeout performed Patient Re-evaluated:Patient Re-evaluated prior to induction Oxygen Delivery Method: Circle system utilized Preoxygenation: Pre-oxygenation with 100% oxygen Induction Type: IV induction LMA: LMA inserted LMA Size: 4.0 Number of attempts: 1 Placement Confirmation: positive ETCO2 and breath sounds checked- equal and bilateral Tube secured with: Tape Dental Injury: Teeth and Oropharynx as per pre-operative assessment

## 2018-06-02 NOTE — Op Note (Signed)
NAME: Janet Powell, Janet Powell MEDICAL RECORD ZO:[id number] ACCOUNT [id number] DATE OF BIRTH:May 01, 1956 FACILITY: [facility] LOCATION: [facility] PHYSICIAN:STEVEN Russ Halo, MD  OPERATIVE REPORT  DATE OF PROCEDURE:  06/02/2018  PREOPERATIVE DIAGNOSIS:  Right knee medial and lateral meniscus tear.  POSTOPERATIVE DIAGNOSIS:  Right knee medial and lateral meniscus tear as well as tricompartmental chondromalacia grade III.  PROCEDURE PERFORMED:  Right knee arthroscopy with partial medial meniscectomy and partial lateral meniscectomy as well as chondroplasty, debridement type, 3 compartments.  ATTENDING SURGEON:  Malon Kindle, MD  ASSISTANT:  None.  ANESTHESIA:  LMA general anesthesia was used.  ESTIMATED BLOOD LOSS:  Minimal.  FLUID REPLACEMENT:  1200 mL crystalloid.  INSTRUMENT COUNTS:  Correct.  COMPLICATIONS:  None.  ANTIBIOTICS:  Perioperative antibiotics were given.  INDICATIONS:  The patient is a 62 year old female with worsening right knee pain secondary to torn medial and lateral menisci.  The patient has failed conservative management.  Desires operative treatment to restore function and limit pain.  Informed  consent obtained.  DESCRIPTION OF PROCEDURE:  After an adequate level of anesthesia was achieved, the patient was positioned supine on the operating table.  Right leg correctly identified and sterilely prepped and draped in the usual manner.  A lateral post was utilized.   Timeout was called, verifying correct patient and correct site.  We then entered the knee using standard arthroscopic portals including superolateral outflow, anterolateral scope and anteromedial working portals.  We identified significant synovitis  throughout the knee joint.  Patellofemoral chondromalacia grade III was noted with loose flaps and fibrillation.  Tangential chondroplasty performed on both the patella and the trochlea, removing unstable articular cartilage and smoothing down  transitions  between the more normal peripheral areas and the more worn central area.  Medial and lateral gutters were inspected.  No loose bodies identified.  We entered the medial compartment.  There was a complex posterior horn medial meniscus tear not  amenable to repair.  We performed a partial meniscectomy, removing about 50% of the medial meniscus.  The remaining meniscal tissue was probed and felt to be stable.  There was weightbearing grade III chondromalacia on the medial femoral condyle and the  medial tibial condyle.  Tangential chondroplasty was performed, smoothing down the articular cartilage.  ACL and PCL intact.  Lateral compartment entered.  There was a complex lateral meniscus tear, horizontal cleavage and longitudinal components.  We  performed a partial meniscectomy, removing about 50% of the meniscus getting back to a stable meniscal tissue.  Anterior and posterior meniscal root integrity intact, both sides.  Weightbearing surface cartilage damage done as well, grade III, with a  fairly deep area of 1 x 1 cm near full-thickness cartilage loss.  We performed a tangential chondroplasty removing unstable flaps and fibrillation and trying to get the defect as smooth as possible.  We did not do an abrasion or stimulation-type  chondroplasty.  At this point, we did a final lavage of the knee joint, inspecting for any additional loose bodies.  None encountered.  We then sutured the wounds with 4-0 Monocryl followed by Steri-Strips and then injected with 0.25% Marcaine with  epinephrine at the end to help with a postop knee block.  We then placed a sterile compressive bandage.  The patient was transported to recovery room in stable condition, having tolerated surgery well.  LN/NUANCE  D:06/02/2018 T:06/02/2018 JOB:[id number]

## 2018-06-02 NOTE — Interval H&P Note (Signed)
History and Physical Interval Note:  06/02/2018 12:13 PM  Janet Powell  has presented today for surgery, with the diagnosis of Right knee medial and lateral meniscal tears  The various methods of treatment have been discussed with the patient and family. After consideration of risks, benefits and other options for treatment, the patient has consented to  Procedure(s): RIGHT KNEE ARTHROSCOPY WITH MEDIAL AND LATERAL MENISECTOMY (Right) as a surgical intervention .  The patient's history has been reviewed, patient examined, no change in status, stable for surgery.  I have reviewed the patient's chart and labs.  Questions were answered to the patient's satisfaction.     Cliff Damiani,STEVEN R

## 2018-06-02 NOTE — Brief Op Note (Signed)
06/02/2018  2:27 PM  PATIENT:  Janet Powell  62 y.o. female  PRE-OPERATIVE DIAGNOSIS:  Right knee medial and lateral meniscal tears  POST-OPERATIVE DIAGNOSIS:  Right knee medial and lateral meniscal tears, chondromalacia Grade III all compartments  PROCEDURE:  Procedure(s): RIGHT KNEE ARTHROSCOPY WITH MEDIAL AND LATERAL MENISECTOMY (Right) Chondroplasty debridement type x 3 compartments  SURGEON:  Surgeon(s) and Role:    Beverely Low, MD - Primary  PHYSICIAN ASSISTANT:   ASSISTANTS: none   ANESTHESIA:   general  EBL:  5 mL   BLOOD ADMINISTERED:none  DRAINS: none   LOCAL MEDICATIONS USED:  MARCAINE     SPECIMEN:  No Specimen  DISPOSITION OF SPECIMEN:  N/A  COUNTS:  YES  TOURNIQUET:  * No tourniquets in log *  DICTATION: .Other Dictation: Dictation Number [id number]  PLAN OF CARE: Admit to inpatient   PATIENT DISPOSITION:  PACU - hemodynamically stable.   Delay start of Pharmacological VTE agent (>24hrs) due to surgical blood loss or risk of bleeding: no

## 2018-06-02 NOTE — Discharge Instructions (Signed)
Ice the knee as much as possible.  Do exercises every hour for 5 minutes.  Place minimal weight on the leg for the first week, then increase slowly week #2  Knee exercises: Calf pumps - move ankle up and down Heel slides - bend knee and slide heel on the couch back and forth Tighten thigh muscle and do straight leg raises  Keep the knee covered and clean and dry. Change the bandage to Band Aids on Sunday. Leave the steri strips in place.  Wear stockings on both legs to prevent blood clots  Start your Plavix and other meds tomorrow  Ok to get the incision wet in the shower next Wednesday.  Follow up with Dr Ranell Patrick in two weeks in the office, call for appt. [phone]

## 2018-06-02 NOTE — Transfer of Care (Signed)
Immediate Anesthesia Transfer of Care Note  Patient: Janet Powell  Procedure(s) Performed: RIGHT KNEE ARTHROSCOPY WITH MEDIAL AND LATERAL MENISECTOMY (Right Knee)  Patient Location: PACU  Anesthesia Type:General  Level of Consciousness: awake, alert  and oriented  Airway & Oxygen Therapy: Patient Spontanous Breathing and Patient connected to nasal cannula oxygen  Post-op Assessment: Report given to RN, Post -op Vital signs reviewed and stable and Patient moving all extremities  Post vital signs: Reviewed and stable  Last Vitals:  Vitals Value Taken Time  BP 139/64 06/02/2018  1:42 PM  Temp 36.2 C 06/02/2018  1:42 PM  Pulse 87 06/02/2018  1:46 PM  Resp 15 06/02/2018  1:46 PM  SpO2 97 % 06/02/2018  1:46 PM  Vitals shown include unvalidated device data.  Last Pain:  Vitals:   06/02/18 1342  TempSrc:   PainSc: 0-No pain         Complications: No apparent anesthesia complications

## 2018-06-02 NOTE — Anesthesia Postprocedure Evaluation (Signed)
Anesthesia Post Note  Patient: Janet Powell  Procedure(s) Performed: RIGHT KNEE ARTHROSCOPY WITH MEDIAL AND LATERAL MENISECTOMY (Right Knee)     Patient location during evaluation: PACU Anesthesia Type: General Level of consciousness: awake and alert Pain management: pain level controlled Vital Signs Assessment: post-procedure vital signs reviewed and stable Respiratory status: spontaneous breathing, nonlabored ventilation, respiratory function stable and patient connected to nasal cannula oxygen Cardiovascular status: blood pressure returned to baseline and stable Postop Assessment: no apparent nausea or vomiting Anesthetic complications: no    Last Vitals:  Vitals:   06/02/18 1415 06/02/18 1427  BP:  131/63  Pulse: 80 76  Resp: 15 15  Temp:    SpO2: 97% 94%    Last Pain:  Vitals:   06/02/18 1400  TempSrc:   PainSc: 5                  Trevor Iha

## 2018-06-03 ENCOUNTER — Encounter (HOSPITAL_COMMUNITY): Payer: Self-pay | Admitting: Orthopedic Surgery

## 2018-06-13 ENCOUNTER — Ambulatory Visit
Admission: RE | Admit: 2018-06-13 | Discharge: 2018-06-13 | Disposition: A | Discharge status: Home / Self Care | Payer: 59 | Source: Ambulatory Visit | Attending: Physician Assistant | Admitting: Physician Assistant

## 2018-06-13 DIAGNOSIS — Z1231 Encounter for screening mammogram for malignant neoplasm of breast: Secondary | ICD-10-CM

## 2019-08-27 IMAGING — MG DIGITAL SCREENING BILATERAL MAMMOGRAM WITH IMPLANTS, CAD AND TOM
9 of 12 series · 9 of 28 positions shown · non-contrast
Comparison: Previous exam(s).

CLINICAL DATA: Screening.

EXAM:
DIGITAL SCREENING BILATERAL MAMMOGRAM WITH IMPLANTS, CAD AND TOMO
The patient has retropectoral implants. Standard and implant
displaced views were performed.

[L MLO]
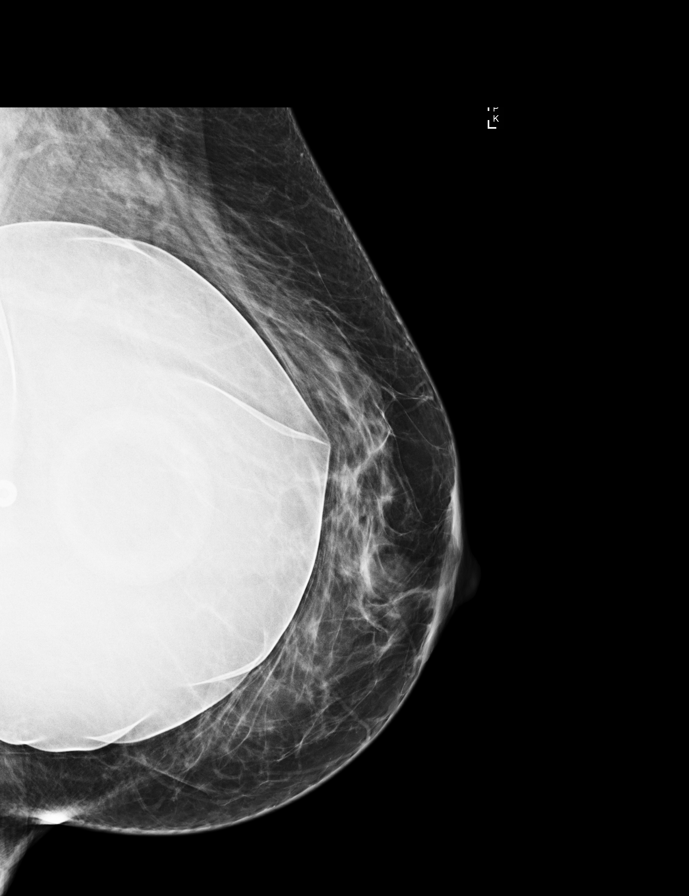

[R MLO]
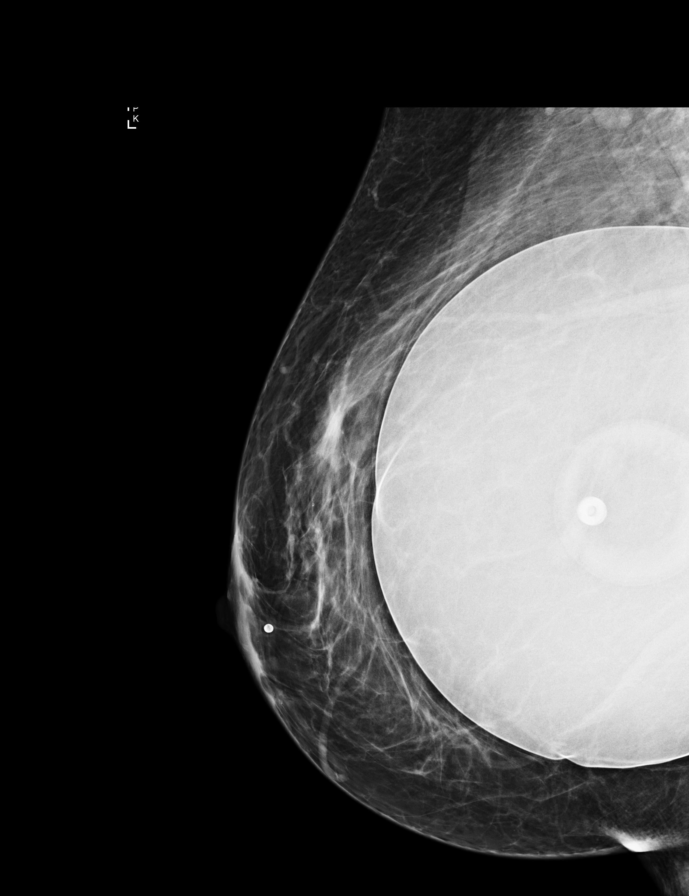

[L CC]
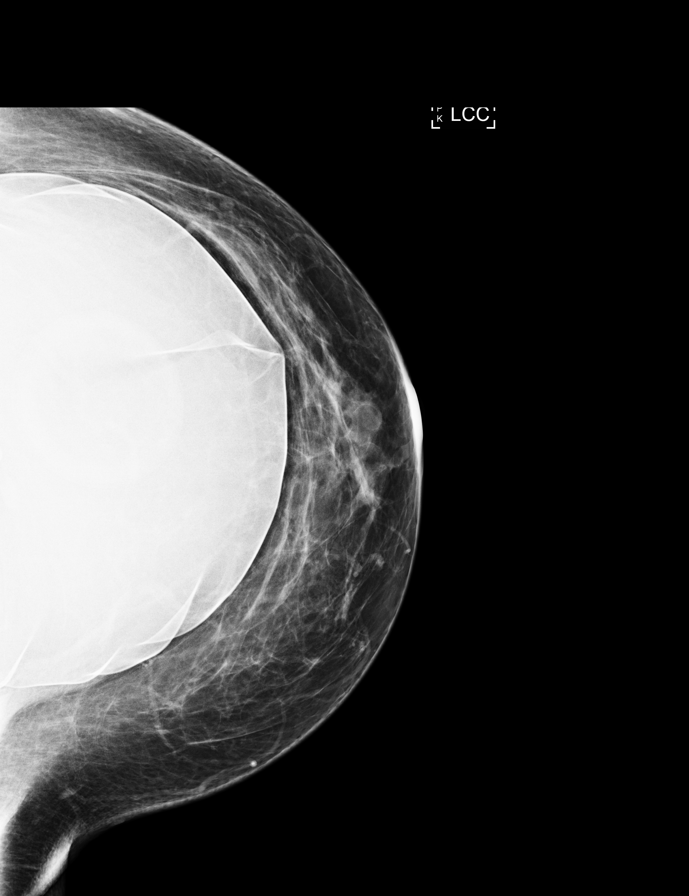

[R CC]
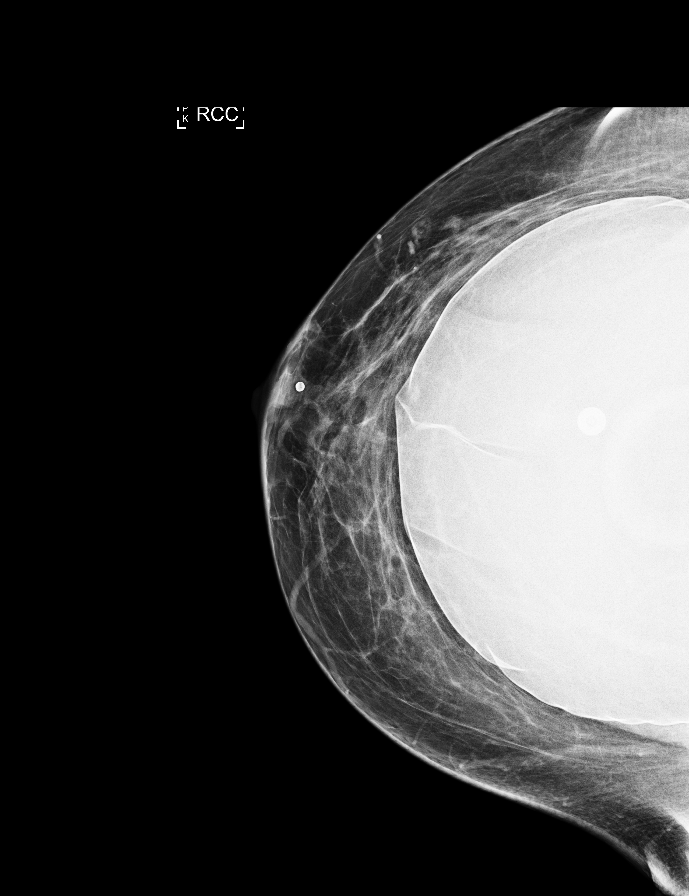

[L MLO synth-2D]
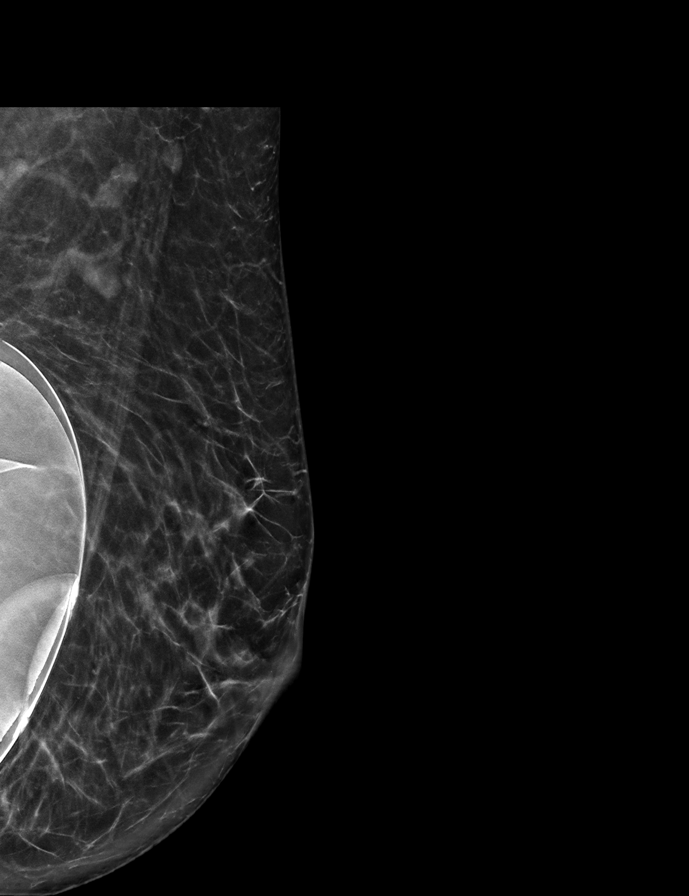

[R MLO synth-2D]
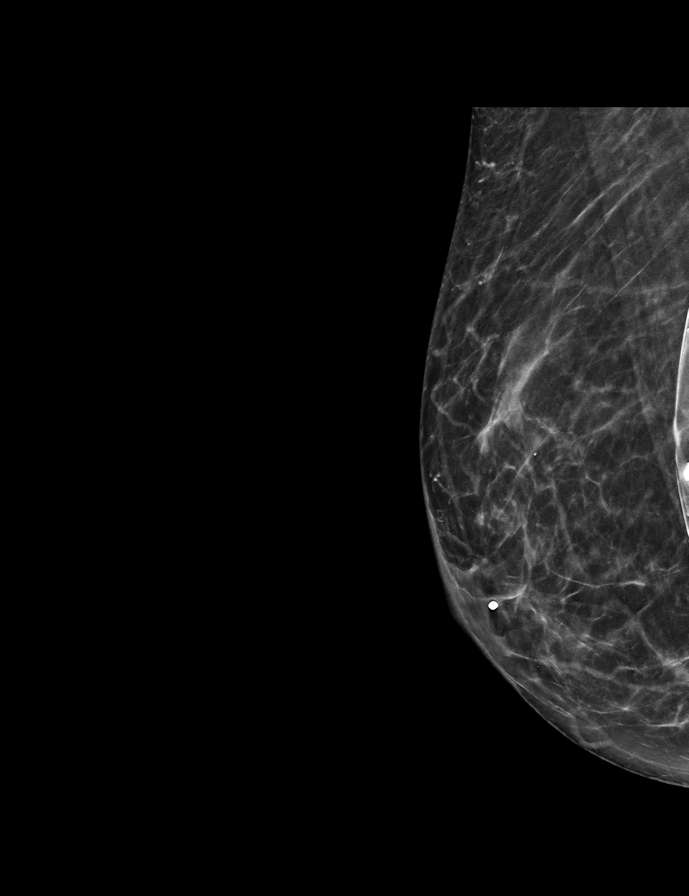

[R CC synth-2D]
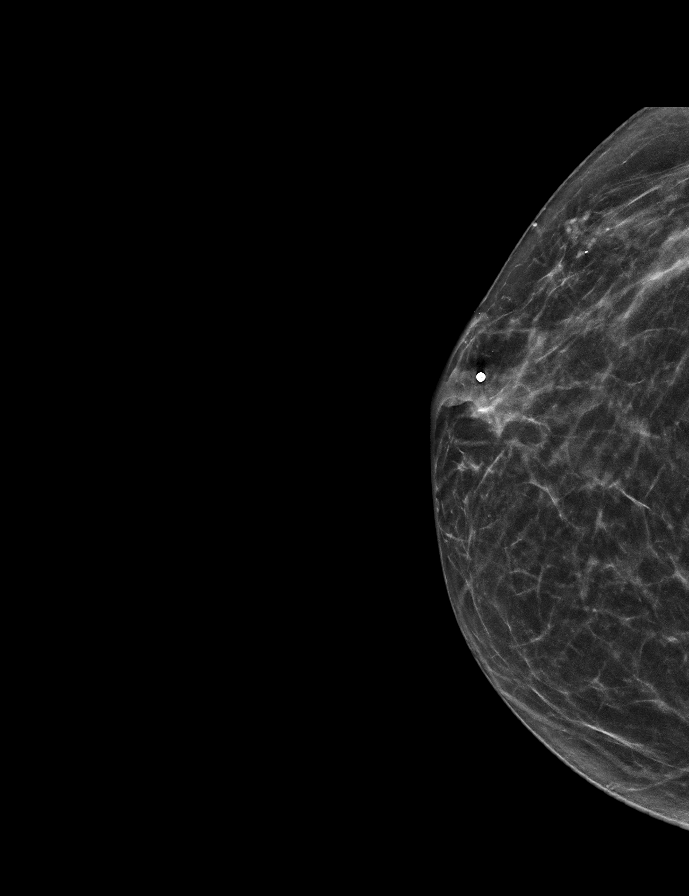

[L CC synth-2D]
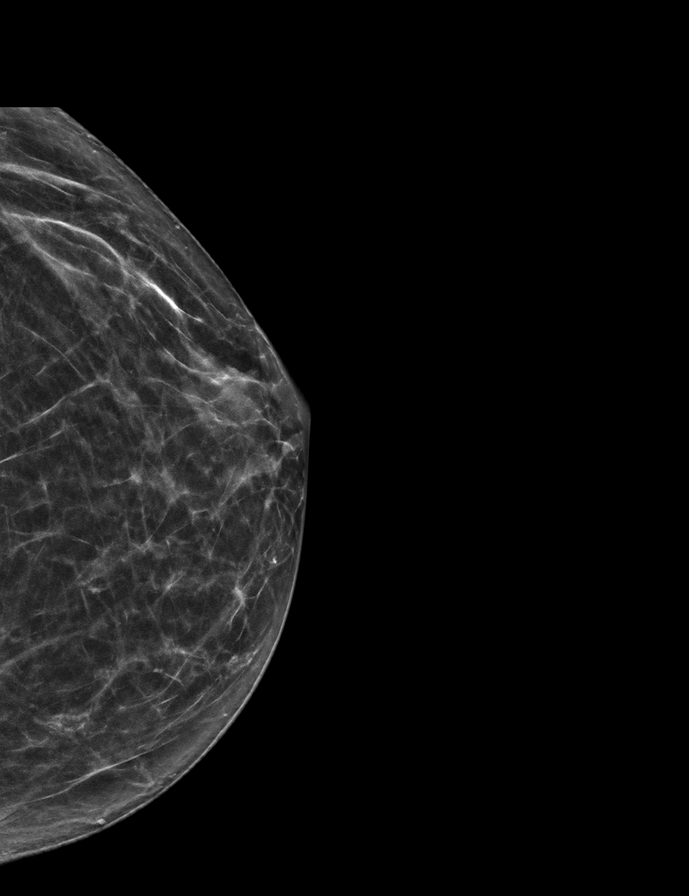

[L MLOID BREAST TOMOSYNTHESIS IMAGE tomo · tomo slice 35/68.0]
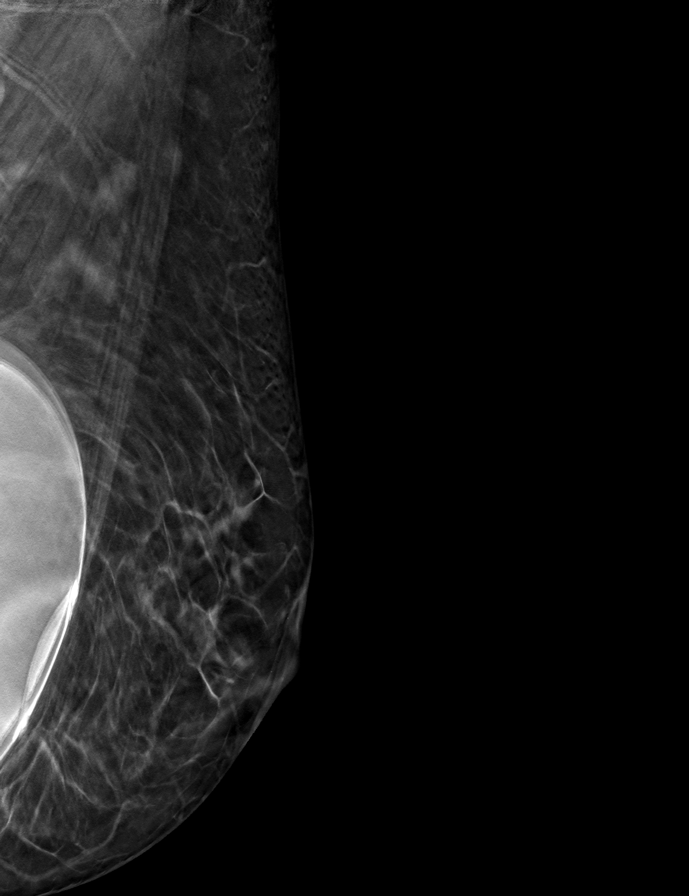

[9 of 28 positions shown; findings below may reference images not displayed]

ACR Breast Density Category b: There are scattered areas of
fibroglandular density.
FINDINGS: There are no findings suspicious for malignancy. Images were
processed with CAD.
IMPRESSION: No mammographic evidence of malignancy. A result letter of this
screening mammogram will be mailed directly to the patient.

RECOMMENDATION:
Screening mammogram in one year. (Code:60-T-8Z4)

BI-RADS CATEGORY  1:  Negative.

## 2021-06-01 ENCOUNTER — Other Ambulatory Visit: Payer: Self-pay | Admitting: Orthopedic Surgery

## 2021-06-01 DIAGNOSIS — M25562 Pain in left knee: Secondary | ICD-10-CM

## 2021-06-02 ENCOUNTER — Ambulatory Visit
Admission: RE | Admit: 2021-06-02 | Discharge: 2021-06-02 | Disposition: A | Discharge status: Home / Self Care | Payer: Worker's Compensation | Source: Ambulatory Visit | Attending: Orthopedic Surgery | Admitting: Orthopedic Surgery

## 2021-06-02 ENCOUNTER — Other Ambulatory Visit: Payer: Self-pay

## 2021-06-02 DIAGNOSIS — M25562 Pain in left knee: Secondary | ICD-10-CM

## 2021-07-29 DIAGNOSIS — K644 Residual hemorrhoidal skin tags: Secondary | ICD-10-CM | POA: Diagnosis not present

## 2021-07-29 DIAGNOSIS — K5900 Constipation, unspecified: Secondary | ICD-10-CM | POA: Diagnosis not present

## 2024-06-04 ENCOUNTER — Other Ambulatory Visit (HOSPITAL_COMMUNITY): Payer: Self-pay | Admitting: Physician Assistant

## 2024-06-04 DIAGNOSIS — Z96652 Presence of left artificial knee joint: Secondary | ICD-10-CM

## 2024-06-11 ENCOUNTER — Encounter (HOSPITAL_COMMUNITY)

## 2024-07-13 ENCOUNTER — Ambulatory Visit (HOSPITAL_COMMUNITY)

## 2024-07-13 ENCOUNTER — Encounter (HOSPITAL_COMMUNITY)

## 2024-10-09 ENCOUNTER — Other Ambulatory Visit (HOSPITAL_COMMUNITY)
